# Patient Record
Sex: Female | Born: 1995
Health system: Southern US, Community
[De-identification: ages and names within clinical notes are randomized; demographics above are authoritative.]

---

## 2018-04-26 ENCOUNTER — Encounter (HOSPITAL_BASED_OUTPATIENT_CLINIC_OR_DEPARTMENT_OTHER): Payer: Self-pay | Admitting: *Deleted

## 2018-04-26 ENCOUNTER — Emergency Department (HOSPITAL_BASED_OUTPATIENT_CLINIC_OR_DEPARTMENT_OTHER)
Admission: EM | Admit: 2018-04-26 | Discharge: 2018-04-26 | Disposition: A | Discharge status: Home / Self Care | Payer: Self-pay | Attending: Emergency Medicine | Admitting: Emergency Medicine

## 2018-04-26 ENCOUNTER — Other Ambulatory Visit: Payer: Self-pay

## 2018-04-26 DIAGNOSIS — N920 Excessive and frequent menstruation with regular cycle: Secondary | ICD-10-CM | POA: Insufficient documentation

## 2018-04-26 LAB — URINALYSIS, ROUTINE W REFLEX MICROSCOPIC
Bilirubin Urine: NEGATIVE
Glucose, UA: NEGATIVE mg/dL
Hgb urine dipstick: NEGATIVE
Ketones, ur: NEGATIVE mg/dL
Leukocytes,Ua: NEGATIVE
Nitrite: NEGATIVE
Protein, ur: NEGATIVE mg/dL
Specific Gravity, Urine: 1.015 (ref 1.005–1.030)
pH: 7 (ref 5.0–8.0)

## 2018-04-26 LAB — PREGNANCY, URINE: PREG TEST UR: NEGATIVE

## 2018-04-26 MED ORDER — IBUPROFEN 600 MG PO TABS: 600.0000 mg | ORAL_TABLET | Freq: Four times a day (QID) | ORAL | 0 refills | Status: AC | PRN

## 2018-04-26 NOTE — Discharge Instructions (Addendum)
Please follow up with your primary care provider within 5-7 days for re-evaluation of your symptoms. If you do not have a primary care provider, information for a healthcare clinic has been provided for you to make arrangements for follow up care.   Please return to the ER sooner if you have any new or worsening symptoms, or if you have any of the following symptoms:  Abdominal pain that does not go away.  You have a fever.  You keep throwing up (vomiting).  The pain is felt only in portions of the abdomen. Pain in the right side could possibly be appendicitis. In an adult, pain in the left lower portion of the abdomen could be colitis or diverticulitis.  You pass bloody or black tarry stools.  There is bright red blood in the stool.  The constipation stays for more than 4 days.  There is belly (abdominal) or rectal pain.  You do not seem to be getting better.  You have any questions or concerns.

## 2018-04-26 NOTE — ED Triage Notes (Signed)
Pt c/o " period cramping" x 2 days

## 2018-04-26 NOTE — ED Provider Notes (Signed)
[facility] EMERGENCY DEPARTMENT Provider Note   CSN: [id number] Arrival date & time: 04/26/18  1702    History   Chief Complaint Chief Complaint  Patient presents with  . Menorrhagia    HPI Dominique Sanchez is a 23 y.o. female.     HPI   Pt is a 23 y/o female who presents to the ED for evaluation of menorrhagia. States she started having menstrual cramps yesterday. States pain was severe yesterday in the lower part of her abdomen.   States she usually has very bad cramps from her menstrual cycle and sxs feel exactly the same as prior cramps from menstruation. States she usually takes midol for sxs. She took this yesterday and had some improvement of pain, but she was concerned because it did not work as well as it had in the past.    States that right now she currently has no pain and rates pain 0/10.  Reports she has had some spotting but denies vaginal discharge. Denies  concern for STD. No NVD, constipation or urinary sxs. No fevers.  States her LMP was about 3-4 weeks ago. States menses are very regular.   History reviewed. No pertinent past medical history.  There are no active problems to display for this patient.  History reviewed. No pertinent surgical history.   OB History   No obstetric history on file.      Home Medications    Prior to Admission medications   Medication Sig Start Date End Date Taking? Authorizing Provider  ibuprofen (ADVIL,MOTRIN) 600 MG tablet Take 1 tablet (600 mg total) by mouth every 6 (six) hours as needed for up to 5 days. 04/26/18 05/01/18  Rayon Mcchristian S, PA-C    Family History History reviewed. No pertinent family history.  Social History Social History   Tobacco Use  . Smoking status: Never Smoker  . Smokeless tobacco: Never Used  Substance Use Topics  . Alcohol use: Not Currently  . Drug use: Not Currently     Allergies   Patient has no known allergies.   Review of Systems Review of Systems   Constitutional: Negative for fever.  HENT: Negative for ear pain and sore throat.   Eyes: Negative for visual disturbance.  Respiratory: Negative for cough and shortness of breath.   Cardiovascular: Negative for chest pain.  Gastrointestinal: Positive for abdominal pain. Negative for constipation, diarrhea, nausea and vomiting.  Genitourinary: Negative for dysuria, flank pain, hematuria and urgency.  Musculoskeletal: Negative for back pain.  Skin: Negative for rash.  Neurological: Negative for headaches.  All other systems reviewed and are negative.    Physical Exam Updated Vital Signs BP 107/67   Pulse 96   Temp 98.7 F (37.1 C) (Oral)   Resp 16   Ht 5\' 3"  (1.6 m)   Wt 53.1 kg   LMP 04/25/2018   SpO2 98%   BMI 20.73 kg/m   Physical Exam Vitals signs and nursing note reviewed.  Constitutional:      General: She is not in acute distress.    Appearance: She is well-developed.     Comments: Pt resting comfortably in bed and on cell phone when I enter the room.   HENT:     Head: Normocephalic and atraumatic.  Eyes:     Conjunctiva/sclera: Conjunctivae normal.  Neck:     Musculoskeletal: Neck supple.  Cardiovascular:     Rate and Rhythm: Normal rate and regular rhythm.     Heart sounds: Normal heart sounds.  No murmur.  Pulmonary:     Effort: Pulmonary effort is normal. No respiratory distress.     Breath sounds: Normal breath sounds.  Abdominal:     General: Bowel sounds are normal. There is no distension.     Palpations: Abdomen is soft.     Tenderness: There is no abdominal tenderness. There is no right CVA tenderness, left CVA tenderness, guarding or rebound.  Skin:    General: Skin is warm and dry.  Neurological:     Mental Status: She is alert.    ED Treatments / Results  Labs (all labs ordered are listed, but only abnormal results are displayed) Labs Reviewed  URINALYSIS, ROUTINE W REFLEX MICROSCOPIC - Abnormal; Notable for the following components:       Result Value   APPearance HAZY (*)    All other components within normal limits  PREGNANCY, URINE    EKG None  Radiology No results found.  Procedures Procedures (including critical care time)  Medications Ordered in ED Medications - No data to display   Initial Impression / Assessment and Plan / ED Course  I have reviewed the triage vital signs and the nursing notes.  Pertinent labs & imaging results that were available during my care of the patient were reviewed by me and considered in my medical decision making (see chart for details).  Final Clinical Impressions(s) / ED Diagnoses   Final diagnoses:  Menorrhagia with regular cycle   Pt here with c/o menstrual cramping. Currently states pain is 0/10 without any intervention with medication today. She is well appearing and is in no distress on evaluation. Vitals are normal. She has no abd tenderness on exam. Pelvic exam not felt to be indicated as pt is completely pain free. She denies concern for std and has no sxs. Labs not indicated. ua negative for uti. Urine preg negative. Pt discharged with rx for motrin. She is requesting a work note today. Advised close f/u and informed of return precautions.   ED Discharge Orders         Ordered    ibuprofen (ADVIL,MOTRIN) 600 MG tablet  Every 6 hours PRN     04/26/18 1746           Karrie Meres, PA-C 04/26/18 1756    Charlynne Pander, MD 04/26/18 [phone]

## 2018-04-26 NOTE — ED Notes (Signed)
ED Provider at bedside. 

## 2018-06-09 ENCOUNTER — Emergency Department (HOSPITAL_BASED_OUTPATIENT_CLINIC_OR_DEPARTMENT_OTHER): Payer: Self-pay

## 2018-06-09 ENCOUNTER — Emergency Department (HOSPITAL_BASED_OUTPATIENT_CLINIC_OR_DEPARTMENT_OTHER)
Admission: EM | Admit: 2018-06-09 | Discharge: 2018-06-09 | Disposition: A | Discharge status: Home / Self Care | Payer: Self-pay | Attending: Emergency Medicine | Admitting: Emergency Medicine

## 2018-06-09 ENCOUNTER — Other Ambulatory Visit: Payer: Self-pay

## 2018-06-09 ENCOUNTER — Encounter (HOSPITAL_BASED_OUTPATIENT_CLINIC_OR_DEPARTMENT_OTHER): Payer: Self-pay

## 2018-06-09 DIAGNOSIS — Y999 Unspecified external cause status: Secondary | ICD-10-CM | POA: Insufficient documentation

## 2018-06-09 DIAGNOSIS — X501XXA Overexertion from prolonged static or awkward postures, initial encounter: Secondary | ICD-10-CM | POA: Insufficient documentation

## 2018-06-09 DIAGNOSIS — Y939 Activity, unspecified: Secondary | ICD-10-CM | POA: Insufficient documentation

## 2018-06-09 DIAGNOSIS — Y929 Unspecified place or not applicable: Secondary | ICD-10-CM | POA: Insufficient documentation

## 2018-06-09 DIAGNOSIS — S99912A Unspecified injury of left ankle, initial encounter: Secondary | ICD-10-CM | POA: Insufficient documentation

## 2018-06-09 NOTE — ED Notes (Signed)
ED Provider at bedside. 

## 2018-06-09 NOTE — ED Triage Notes (Signed)
Pt states she "turned" her left ankle today-NAD-to triage in w/c

## 2018-06-09 NOTE — Discharge Instructions (Signed)
You have been seen today for an ankle injury. There were no acute abnormalities on the x-rays, including no sign of fracture or dislocation, however, there could be injuries to the soft tissues, such as the ligaments or tendons that are not seen on xrays. There could also be what are called occult fractures that are small fractures not seen on xray. Antiinflammatory medications: Take 600 mg of ibuprofen every 6 hours or 440 mg (over the counter dose) to 500 mg (prescription dose) of naproxen every 12 hours for the next 3 days. After this time, these medications may be used as needed for pain. Take these medications with food to avoid upset stomach. Choose only one of these medications, do not take them together. Acetaminophen (generic for Tylenol): Should you continue to have additional pain while taking the ibuprofen or naproxen, you may add in acetaminophen as needed. Your daily total maximum amount of acetaminophen from all sources should be limited to 4000mg /day for persons without liver problems, or 2000mg /day for those with liver problems. Ice: May apply ice to the area over the next 24 hours for 15 minutes at a time to reduce swelling. Elevation: Keep the extremity elevated as often as possible to reduce pain and inflammation. Support: Wear the ankle splint for support and comfort. Wear this until pain resolves. You will be weight-bearing as tolerated, which means you can slowly start to put weight on the extremity and increase amount and frequency as pain allows. Follow up: If symptoms are improving, you may follow up with your primary care provider for any continued management. If symptoms are not starting to improve within a week, you should follow up with the orthopedic specialist within two weeks. Return: Return to the ED for numbness, weakness, increasing pain, overall worsening symptoms, loss of function, or if symptoms are not improving, you have tried to follow up with the orthopedic  specialist, and have been unable to do so.  For prescription assistance, may try using prescription discount sites or apps, such as goodrx.com

## 2018-06-09 NOTE — ED Provider Notes (Addendum)
[facility] EMERGENCY DEPARTMENT Provider Note   CSN: [id number] Arrival date & time: 06/09/18  2235    History   Chief Complaint Chief Complaint  Patient presents with  . Ankle Injury    HPI Dominique Sanchez is a 23 y.o. female.     HPI   Dominique Sanchez is a 23 y.o. female, presenting to the ED with a left ankle injury that occurred this evening.  States she "turned" her ankle while "playing around."  She would not give further details.  When I asked other questions, she sighed and rolled her eyes.  Denies numbness, other injuries.     History reviewed. No pertinent past medical history.  There are no active problems to display for this patient.   History reviewed. No pertinent surgical history.   OB History   No obstetric history on file.      Home Medications    Prior to Admission medications   Not on File    Family History No family history on file.  Social History Social History   Tobacco Use  . Smoking status: Never Smoker  . Smokeless tobacco: Never Used  Substance Use Topics  . Alcohol use: Yes    Comment: occ  . Drug use: Yes    Types: Marijuana     Allergies   Patient has no known allergies.   Review of Systems Review of Systems  Musculoskeletal: Positive for arthralgias.  Neurological: Negative for numbness.     Physical Exam Updated Vital Signs BP 97/81 (BP Location: Right Arm)   Pulse 79   Temp 97.8 F (36.6 C) (Oral)   Resp 16   Ht 5\' 3"  (1.6 m)   Wt 53.1 kg   LMP 06/06/2018   SpO2 99%   BMI 20.73 kg/m   Physical Exam Vitals signs and nursing note reviewed.  Constitutional:      General: She is not in acute distress.    Appearance: She is well-developed. She is not diaphoretic.  HENT:     Head: Normocephalic and atraumatic.  Eyes:     Conjunctiva/sclera: Conjunctivae normal.  Neck:     Musculoskeletal: Neck supple.  Cardiovascular:     Rate and Rhythm: Normal rate and regular rhythm.     Pulses:  Normal pulses.          Dorsalis pedis pulses are 2+ on the right side and 2+ on the left side.       Posterior tibial pulses are 2+ on the right side and 2+ on the left side.  Pulmonary:     Effort: Pulmonary effort is normal.  Musculoskeletal:     Left ankle: Tenderness.       Feet:  Skin:    General: Skin is warm and dry.     Capillary Refill: Capillary refill takes less than 2 seconds.     Coloration: Skin is not pale.  Neurological:     Mental Status: She is alert.     Comments: Sensation to light touch grossly intact in the left foot and toes. Strength 5/5 in the left ankle.  Psychiatric:        Behavior: Behavior normal.      ED Treatments / Results  Labs (all labs ordered are listed, but only abnormal results are displayed) Labs Reviewed - No data to display  EKG None  Radiology Dg Ankle Complete Left  Result Date: 06/09/2018 CLINICAL DATA:  Injury EXAM: LEFT ANKLE COMPLETE - 3+ VIEW COMPARISON:  None.  FINDINGS: There is no evidence of fracture, dislocation, or joint effusion. There is no evidence of arthropathy or other focal bone abnormality. Soft tissues are unremarkable. IMPRESSION: Negative. Electronically Signed   By: Jasmine PangKim  Fujinaga M.D.   On: 06/09/2018 23:14    Procedures Procedures (including critical care time)  Medications Ordered in ED Medications - No data to display   Initial Impression / Assessment and Plan / ED Course  I have reviewed the triage vital signs and the nursing notes.  Pertinent labs & imaging results that were available during my care of the patient were reviewed by me and considered in my medical decision making (see chart for details).        Patient presents with left ankle injury.  No evidence of neurovascular compromise.  X-ray negative for acute osseous injury.  Patient placed in ASO splint and given crutches weightbearing as tolerated.  Orthopedic follow-up as needed. The patient was given instructions for home care as  well as return precautions. Patient voices understanding of these instructions, accepts the plan, and is comfortable with discharge.  Final Clinical Impressions(s) / ED Diagnoses   Final diagnoses:  Injury of left ankle, initial encounter    ED Discharge Orders    None       Concepcion LivingJoy, Dina Warbington C, PA-C 06/09/18 2343    Anselm PancoastJoy, Emeric Novinger C, PA-C 06/09/18 2344    Dione BoozeGlick, David, MD 06/10/18 2253

## 2018-07-12 ENCOUNTER — Other Ambulatory Visit: Payer: Self-pay

## 2018-07-12 ENCOUNTER — Emergency Department (HOSPITAL_BASED_OUTPATIENT_CLINIC_OR_DEPARTMENT_OTHER)
Admission: EM | Admit: 2018-07-12 | Discharge: 2018-07-12 | Disposition: A | Discharge status: Home / Self Care | Payer: Medicaid Other | Attending: Emergency Medicine | Admitting: Emergency Medicine

## 2018-07-12 ENCOUNTER — Encounter (HOSPITAL_BASED_OUTPATIENT_CLINIC_OR_DEPARTMENT_OTHER): Payer: Self-pay

## 2018-07-12 DIAGNOSIS — R1084 Generalized abdominal pain: Secondary | ICD-10-CM

## 2018-07-12 LAB — PREGNANCY, URINE: Preg Test, Ur: NEGATIVE

## 2018-07-12 LAB — URINALYSIS, ROUTINE W REFLEX MICROSCOPIC
Bilirubin Urine: NEGATIVE
Glucose, UA: NEGATIVE mg/dL
Hgb urine dipstick: NEGATIVE
Ketones, ur: NEGATIVE mg/dL
Leukocytes,Ua: NEGATIVE
Nitrite: NEGATIVE
Protein, ur: NEGATIVE mg/dL
Specific Gravity, Urine: >1.03 — ABNORMAL HIGH (ref 1.005–1.030)
pH: 6.5 (ref 5.0–8.0)

## 2018-07-12 MED ORDER — DICYCLOMINE HCL 20 MG PO TABS: 20.0000 mg | ORAL_TABLET | Freq: Two times a day (BID) | ORAL | 0 refills | Status: AC

## 2018-07-12 NOTE — ED Provider Notes (Signed)
[facility] EMERGENCY DEPARTMENT Provider Note   CSN: [id number] Arrival date & time: 07/12/18  1745    History   Chief Complaint Chief Complaint  Patient presents with  . Abdominal Pain    HPI Dominique Sanchez is a 23 y.o. female.     23 yo F with a chief complaint of diffuse abdominal cramping.  This started after lunch today when the patient had a large meal.  No one else that she knows ate with her and so no one else is sick.  She denies nausea vomiting or diarrhea.  She feels like she may need to have a bowel movement but has not yet.  Denies urinary symptoms.  Denies fevers or chills.  The history is provided by the patient.  Abdominal Pain  Pain location:  Generalized Pain quality: cramping   Pain radiates to:  Does not radiate Pain severity:  Moderate Onset quality:  Gradual Duration:  4 hours Timing:  Constant Progression:  Unchanged Chronicity:  New Relieved by:  Nothing Associated symptoms: no chest pain, no chills, no dysuria, no fever, no nausea, no shortness of breath and no vomiting     History reviewed. No pertinent past medical history.  There are no active problems to display for this patient.   History reviewed. No pertinent surgical history.   OB History   No obstetric history on file.      Home Medications    Prior to Admission medications   Medication Sig Start Date End Date Taking? Authorizing Provider  dicyclomine (BENTYL) 20 MG tablet Take 1 tablet (20 mg total) by mouth 2 (two) times daily. 07/12/18   Melene PlanFloyd, Nakhi Choi, DO    Family History No family history on file.  Social History Social History   Tobacco Use  . Smoking status: Never Smoker  . Smokeless tobacco: Never Used  Substance Use Topics  . Alcohol use: Yes    Comment: occ  . Drug use: Yes    Types: Marijuana     Allergies   Patient has no known allergies.   Review of Systems Review of Systems  Constitutional: Negative for chills and fever.  HENT:  Negative for congestion and rhinorrhea.   Eyes: Negative for redness and visual disturbance.  Respiratory: Negative for shortness of breath and wheezing.   Cardiovascular: Negative for chest pain and palpitations.  Gastrointestinal: Positive for abdominal pain. Negative for nausea and vomiting.  Genitourinary: Negative for dysuria and urgency.  Musculoskeletal: Negative for arthralgias and myalgias.  Skin: Negative for pallor and wound.  Neurological: Negative for dizziness and headaches.     Physical Exam Updated Vital Signs BP 95/60 (BP Location: Left Arm)   Pulse 84   Temp 99 F (37.2 C) (Oral)   Resp 20   Ht 5\' 3"  (1.6 m)   Wt 52.6 kg   LMP 07/10/2018   SpO2 99%   BMI 20.55 kg/m   Physical Exam Vitals signs and nursing note reviewed.  Constitutional:      General: She is not in acute distress.    Appearance: She is well-developed. She is not diaphoretic.  HENT:     Head: Normocephalic and atraumatic.  Eyes:     Pupils: Pupils are equal, round, and reactive to light.  Neck:     Musculoskeletal: Normal range of motion and neck supple.  Cardiovascular:     Rate and Rhythm: Normal rate and regular rhythm.     Heart sounds: No murmur. No friction rub. No gallop.  Pulmonary:     Effort: Pulmonary effort is normal.     Breath sounds: No wheezing or rales.  Abdominal:     General: There is no distension.     Palpations: Abdomen is soft.     Tenderness: There is no abdominal tenderness.     Comments: Benign abdominal exam  Musculoskeletal:        General: No tenderness.  Skin:    General: Skin is warm and dry.  Neurological:     Mental Status: She is alert and oriented to person, place, and time.  Psychiatric:        Behavior: Behavior normal.      ED Treatments / Results  Labs (all labs ordered are listed, but only abnormal results are displayed) Labs Reviewed  URINALYSIS, ROUTINE W REFLEX MICROSCOPIC - Abnormal; Notable for the following components:       Result Value   APPearance CLOUDY (*)    Specific Gravity, Urine >1.030 (*)    All other components within normal limits  PREGNANCY, URINE    EKG None  Radiology No results found.  Procedures Procedures (including critical care time)  Medications Ordered in ED Medications - No data to display   Initial Impression / Assessment and Plan / ED Course  I have reviewed the triage vital signs and the nursing notes.  Pertinent labs & imaging results that were available during my care of the patient were reviewed by me and considered in my medical decision making (see chart for details).        23 yo F with a chief complaint of diffuse abdominal cramping.  This is after eating a large lunch.  She has a benign abdominal exam for me.  Urine is negative for infection she is not pregnant.  I feel that a laboratory evaluation would be unlikely to be helpful.  We will have the patient use a short course of Bentyl.  6:33 PM:  I have discussed the diagnosis/risks/treatment options with the patient and believe the pt to be eligible for discharge home to follow-up with PCP. We also discussed returning to the ED immediately if new or worsening sx occur. We discussed the sx which are most concerning (e.g., sudden worsening pain, fever, inability to tolerate by mouth ) that necessitate immediate return. Medications administered to the patient during their visit and any new prescriptions provided to the patient are listed below.  Medications given during this visit Medications - No data to display   The patient appears reasonably screen and/or stabilized for discharge and I doubt any other medical condition or other [facility] requiring further screening, evaluation, or treatment in the ED at this time prior to discharge.    Final Clinical Impressions(s) / ED Diagnoses   Final diagnoses:  Generalized abdominal pain    ED Discharge Orders         Ordered    dicyclomine (BENTYL) 20 MG tablet  2 times  daily     07/12/18 1826           Deno Etienne, DO 07/12/18 [phone]

## 2018-07-12 NOTE — Discharge Instructions (Signed)
Return for fever, vomiting, worsening pain.  Follow up with your doctor.

## 2018-07-12 NOTE — ED Triage Notes (Signed)
C/o abd pain started ~30-45 min PTA after eating [place] food-denies v/d-NAD-steady gait

## 2018-07-13 MED FILL — DICYCLOMINE 20 MG TABLET: 20 | 10 days supply | Qty: 20 | Fill #0

## 2018-09-05 ENCOUNTER — Encounter (HOSPITAL_BASED_OUTPATIENT_CLINIC_OR_DEPARTMENT_OTHER): Payer: Self-pay | Admitting: Emergency Medicine

## 2018-09-05 ENCOUNTER — Other Ambulatory Visit: Payer: Self-pay

## 2018-09-05 ENCOUNTER — Emergency Department (HOSPITAL_BASED_OUTPATIENT_CLINIC_OR_DEPARTMENT_OTHER)
Admission: EM | Admit: 2018-09-05 | Discharge: 2018-09-05 | Disposition: A | Discharge status: Home / Self Care | Payer: Medicaid Other | Attending: Emergency Medicine | Admitting: Emergency Medicine

## 2018-09-05 DIAGNOSIS — M542 Cervicalgia: Secondary | ICD-10-CM

## 2018-09-05 DIAGNOSIS — J039 Acute tonsillitis, unspecified: Secondary | ICD-10-CM | POA: Insufficient documentation

## 2018-09-05 DIAGNOSIS — J029 Acute pharyngitis, unspecified: Secondary | ICD-10-CM

## 2018-09-05 LAB — GROUP A STREP BY PCR: Group A Strep by PCR: NOT DETECTED

## 2018-09-05 MED ORDER — IBUPROFEN 600 MG PO TABS: 600.0000 mg | ORAL_TABLET | Freq: Four times a day (QID) | ORAL | 0 refills | Status: DC | PRN

## 2018-09-05 MED ORDER — ACETAMINOPHEN 500 MG PO TABS: 500.0000 mg | ORAL_TABLET | Freq: Four times a day (QID) | ORAL | 0 refills | Status: AC | PRN

## 2018-09-05 MED ORDER — DEXAMETHASONE 6 MG PO TABS
10.0000 mg | ORAL_TABLET | Freq: Once | ORAL | Status: AC
  Administered 2018-09-05: 14:00:00 10 mg via ORAL
  Filled 2018-09-05: qty 1

## 2018-09-05 MED ORDER — AMOXICILLIN 500 MG PO CAPS: 500.0000 mg | ORAL_CAPSULE | Freq: Two times a day (BID) | ORAL | 0 refills | Status: AC

## 2018-09-05 NOTE — ED Provider Notes (Signed)
[facility] EMERGENCY DEPARTMENT Provider Note   CSN: [id number] Arrival date & time: 09/05/18  1251    History   Chief Complaint Chief Complaint  Patient presents with  . Neck Pain    HPI Dominique Sanchez is a 23 y.o. female who presents with a 2-day history of right-sided throat and neck pain.  It is worse when she swallows.  She denies any injury.  She reports it started after she swallowed a hot piece of fried chicken.  Patient denies any associated symptoms including fever, cough, nasal congestion, ear pain.  She did not take any medications at home for symptoms.  She was around someone with strep throat in the last couple weeks and does report they were sharing drinks. Patient denies any oral sex.     HPI  History reviewed. No pertinent past medical history.  There are no active problems to display for this patient.   History reviewed. No pertinent surgical history.   OB History   No obstetric history on file.      Home Medications    Prior to Admission medications   Medication Sig Start Date End Date Taking? Authorizing Provider  acetaminophen (TYLENOL) 500 MG tablet Take 1 tablet (500 mg total) by mouth every 6 (six) hours as needed. 09/05/18   Elodia Haviland, Waylan BogaAlexandra M, PA-C  amoxicillin (AMOXIL) 500 MG capsule Take 1 capsule (500 mg total) by mouth 2 (two) times daily. 09/05/18   Niamh Rada, Waylan BogaAlexandra M, PA-C  dicyclomine (BENTYL) 20 MG tablet Take 1 tablet (20 mg total) by mouth 2 (two) times daily. 07/12/18   Melene PlanFloyd, Dan, DO  ibuprofen (ADVIL) 600 MG tablet Take 1 tablet (600 mg total) by mouth every 6 (six) hours as needed. 09/05/18   Emi HolesLaw, Yitzel Shasteen M, PA-C    Family History No family history on file.  Social History Social History   Tobacco Use  . Smoking status: Never Smoker  . Smokeless tobacco: Never Used  Substance Use Topics  . Alcohol use: Yes    Comment: occ  . Drug use: Yes    Types: Marijuana     Allergies   Patient has no known allergies.    Review of Systems Review of Systems  Constitutional: Negative for fever.  HENT: Positive for sore throat. Negative for congestion and ear pain.   Respiratory: Negative for cough and shortness of breath.   Cardiovascular: Negative for chest pain.  Musculoskeletal: Positive for neck pain.     Physical Exam Updated Vital Signs BP 114/71 (BP Location: Right Arm)   Pulse 91   Temp 98.3 F (36.8 C) (Oral)   Resp 17   Ht 5\' 3"  (1.6 m)   Wt 50.8 kg   LMP 09/02/2018   SpO2 99%   BMI 19.84 kg/m   Physical Exam Vitals signs and nursing note reviewed.  Constitutional:      General: She is not in acute distress.    Appearance: She is well-developed. She is not diaphoretic.  HENT:     Head: Normocephalic and atraumatic.     Right Ear: Tympanic membrane normal.     Left Ear: Tympanic membrane normal.     Mouth/Throat:     Pharynx: Pharyngeal swelling, oropharyngeal exudate and posterior oropharyngeal erythema present.     Tonsils: Tonsillar exudate present. No tonsillar abscesses. 2+ on the right. 1+ on the left.     Comments: Tonsillar erythema and edema with exudate on the right; left looks basically normal Eyes:  General: No scleral icterus.       Right eye: No discharge.        Left eye: No discharge.     Conjunctiva/sclera: Conjunctivae normal.     Pupils: Pupils are equal, round, and reactive to light.  Neck:     Musculoskeletal: Full passive range of motion without pain, normal range of motion and neck supple.     Thyroid: No thyromegaly.     Comments: Some tenderness under the submandibular region and one area along the anterior chain, but no palpable lymph nodes; no masses or edema noted Cardiovascular:     Rate and Rhythm: Normal rate and regular rhythm.     Heart sounds: Normal heart sounds. No murmur. No friction rub. No gallop.   Pulmonary:     Effort: Pulmonary effort is normal. No respiratory distress.     Breath sounds: Normal breath sounds. No stridor. No  wheezing or rales.  Abdominal:     General: Bowel sounds are normal. There is no distension.     Palpations: Abdomen is soft.     Tenderness: There is no abdominal tenderness. There is no guarding or rebound.  Lymphadenopathy:     Cervical: No cervical adenopathy.  Skin:    General: Skin is warm and dry.     Coloration: Skin is not pale.     Findings: No rash.  Neurological:     Mental Status: She is alert.     Coordination: Coordination normal.      ED Treatments / Results  Labs (all labs ordered are listed, but only abnormal results are displayed) Labs Reviewed  GROUP A STREP BY PCR    EKG None  Radiology No results found.  Procedures Procedures (including critical care time)  Medications Ordered in ED Medications  dexamethasone (DECADRON) tablet 10 mg (10 mg Oral Given 09/05/18 1349)     Initial Impression / Assessment and Plan / ED Course  I have reviewed the triage vital signs and the nursing notes.  Pertinent labs & imaging results that were available during my care of the patient were reviewed by me and considered in my medical decision making (see chart for details).        Patient presenting with a unilateral tonsillitis with exudate.  Strep is negative.  Considering appearance and contact with strep positive person, will treat with amoxicillin.  Decadron given.  Patient denies any oral intercourse, so gonorrhea chlamydia risk is low.  No indication for imaging at this time.  Low suspicion of deep space infection.  Patient advised to take ibuprofen and Tylenol and gargle with salt water.  Return precautions discussed including unilateral swelling, trismus, inability to tolerate saliva.  Patient understands and agrees with plan.  Patient vital stable throughout ED course and discharged in satisfactory condition. I discussed patient case with Dr. Ronnald Nian who guided the patient's management and agrees with plan.   Final Clinical Impressions(s) / ED Diagnoses    Final diagnoses:  Sore throat  Neck pain    ED Discharge Orders         Ordered    amoxicillin (AMOXIL) 500 MG capsule  2 times daily     09/05/18 1454    ibuprofen (ADVIL) 600 MG tablet  Every 6 hours PRN     09/05/18 1454    acetaminophen (TYLENOL) 500 MG tablet  Every 6 hours PRN     09/05/18 1454           Seretha Estabrooks, Bea Graff,  PA-C 09/05/18 1649    Virgina Norfolkuratolo, Adam, DO 09/05/18 1706

## 2018-09-05 NOTE — Discharge Instructions (Addendum)
Your strep test was negative, however considering the swelling in pus on the tonsils and is only on one side, I will cover with antibiotics to treat bacterial infection.  Take amoxicillin until completed, even if you are feeling better.  Take ibuprofen and Tylenol as prescribed, as needed for pain.  You can gargle with warm salt water several times daily as well.  Please return to the emergency department if you develop any new or worsening symptoms including asymmetry in your throat with one tonsil moving toward the midline, lockjaw, inability to swallow your own spit, large masses in your neck, or any other concerning symptoms.

## 2018-09-05 NOTE — ED Triage Notes (Signed)
R side neck pain since yesterday.

## 2019-07-03 ENCOUNTER — Other Ambulatory Visit: Payer: Self-pay

## 2019-07-03 ENCOUNTER — Inpatient Hospital Stay (HOSPITAL_COMMUNITY)
Admission: AD | Admit: 2019-07-03 | Discharge: 2019-07-03 | Disposition: A | Discharge status: Home / Self Care | Payer: Medicaid Other | Attending: Obstetrics and Gynecology | Admitting: Obstetrics and Gynecology

## 2019-07-03 DIAGNOSIS — Z3201 Encounter for pregnancy test, result positive: Secondary | ICD-10-CM

## 2019-07-03 DIAGNOSIS — Z7689 Persons encountering health services in other specified circumstances: Secondary | ICD-10-CM

## 2019-07-03 DIAGNOSIS — Z331 Pregnant state, incidental: Secondary | ICD-10-CM | POA: Insufficient documentation

## 2019-07-03 NOTE — MAU Note (Signed)
Dominique Sanchez is a 24 y.o. here in MAU reporting: states she had a + UPT at home. Wants to know how many weeks she is. No pain, bleeding, or discharge.  LMP: 06/10/19 approximately. States it was abnormal color and bleeding was only a couple of days.  Pain score: 0/10  Vitals:   07/03/19 1625  BP: 116/73  Pulse: 79  Resp: 16  Temp: 98.6 F (37 C)  SpO2: 100%     Lab orders placed from triage: none

## 2019-07-03 NOTE — MAU Provider Note (Signed)
First Provider Initiated Contact with Patient 07/03/19 1631      S Ms. Dominique Sanchez is a 24 y.o. No obstetric history on file pregnant female who presents to MAU today with complaint of none. Patient reports she was told to come here by her cousin for an ultrasound to prove to the baby's father that the baby is his. UPT positive at home.  O BP 116/73 (BP Location: Right Arm)   Pulse 79   Temp 98.6 F (37 C) (Oral)   Resp 16   SpO2 100% Comment: room air   Patient Vitals for the past 24 hrs:  BP Temp Temp src Pulse Resp SpO2  07/03/19 1625 116/73 98.6 F (37 C) Oral 79 16 100 %   Physical Exam  Constitutional: She is oriented to person, place, and time. She appears well-developed and well-nourished. No distress.  HENT:  Head: Normocephalic and atraumatic.  Respiratory: Effort normal.  Neurological: She is alert and oriented to person, place, and time.  Skin: She is not diaphoretic.  Psychiatric: She has a normal mood and affect. Her behavior is normal. Judgment and thought content normal.   A Pregnant female Medical screening exam complete  P Discharge from MAU in stable condition Patient given the option of transfer to [facility] for further evaluation or seek care in outpatient facility of choice List of options for follow-up given, printed list of OB providers given Warning signs for worsening condition that would warrant emergency follow-up discussed Patient may return to MAU as needed for pregnancy related complaints  Nugent, Odie Sera, NP 07/03/2019 5:03 PM

## 2019-07-06 ENCOUNTER — Inpatient Hospital Stay (HOSPITAL_COMMUNITY)
Admission: AD | Admit: 2019-07-06 | Discharge: 2019-07-06 | Disposition: A | Discharge status: Home / Self Care | Payer: Medicaid Other | Attending: Family Medicine | Admitting: Family Medicine

## 2019-07-06 ENCOUNTER — Other Ambulatory Visit: Payer: Self-pay

## 2019-07-06 ENCOUNTER — Telehealth: Payer: Self-pay | Admitting: Obstetrics and Gynecology

## 2019-07-06 DIAGNOSIS — Z3202 Encounter for pregnancy test, result negative: Secondary | ICD-10-CM

## 2019-07-06 DIAGNOSIS — N939 Abnormal uterine and vaginal bleeding, unspecified: Secondary | ICD-10-CM | POA: Insufficient documentation

## 2019-07-06 LAB — HCG, QUANTITATIVE, PREGNANCY: hCG, Beta Chain, Quant, S: <1 m[IU]/mL (ref <5)

## 2019-07-06 LAB — POCT PREGNANCY, URINE: Preg Test, Ur: NEGATIVE

## 2019-07-06 NOTE — Telephone Encounter (Signed)
Patient called and notified of Quant results which are less than 1.   Duane Lope, NP 07/06/2019 3:43 PM

## 2019-07-06 NOTE — Discharge Instructions (Signed)
Human Chorionic Gonadotropin Test Why am I having this test? A human chorionic gonadotropin (hCG) test is done to determine whether you are pregnant. It can also be used:  To diagnose an abnormal pregnancy.  To determine whether you have had a failed pregnancy (miscarriage) or are at risk of one. What is being tested? This test checks the level of the human chorionic gonadotropin (hCG) hormone in the blood. This hormone is produced during pregnancy by the cells that form the placenta. The placenta is the organ that grows inside your womb (uterus) to nourish a developing baby. When you are pregnant, hCG can be detected in your blood or urine 7 to 8 days before your missed period. It continues to go up for the first 8-10 weeks of pregnancy. The presence of hCG in your blood can be measured with several different types of tests. You may have:  A urine test. ? Because this hormone is eliminated from your body by your kidneys, you may have a urine test to find out whether you are pregnant. A home pregnancy test detects whether there is hCG in your urine. ? A urine test only shows whether there is hCG in your urine. It does not measure how much.  A qualitative blood test. ? You may have this type of blood test to find out if you are pregnant. ? This blood test only shows whether there is hCG in your blood. It does not measure how much.  A quantitative blood test. ? This type of blood test measures the amount of hCG in your blood. ? You may have this test to:  Diagnose an abnormal pregnancy.  Check whether you have had a miscarriage.  Determine whether you are at risk of a miscarriage. What kind of sample is taken?     Two kinds of samples may be collected to test for the hCG hormone.  Blood. It is usually collected by inserting a needle into a blood vessel.  Urine. It is usually collected by urinating into a germ-free (sterile) specimen cup. It is best to collect the sample the first  time you urinate in the morning. How do I prepare for this test? No preparation is needed for a blood test.  For the urine test:  Let your health care provider know about: ? All medicines you are taking, including vitamins, herbs, creams, and over-the-counter medicines. ? Any blood in your urine. This may interfere with the result.  Do not drink too much fluid. Drink as you normally would, or as directed by your health care provider. How are the results reported? Depending on the type of test that you have, your test results may be reported as values. Your health care provider will compare your results to normal ranges that were established after testing a large group of people (reference ranges). Reference ranges may vary among labs and hospitals. For this test, common reference ranges that show absence of pregnancy are:  Quantitative hCG blood levels: less than 5 IU/L. Other results will be reported as either positive or negative. For this test, normal results (meaning the absence of pregnancy) are:  Negative for hCG in the urine test.  Negative for hCG in the qualitative blood test. What do the results mean? Urine and qualitative blood test  A negative result could mean: ? That you are not pregnant. ? That the test was done too early in your pregnancy to detect hCG in your blood or urine. If you still have other signs   of pregnancy, the test will be repeated.  A positive result means: ? That you are most likely pregnant. Your health care provider may confirm your pregnancy with an imaging study (ultrasound) of your uterus, if needed. Quantitative blood test Results of the quantitative hCG blood test will be interpreted as follows:  Less than 5 IU/L: You are most likely not pregnant.  Greater than 25 IU/L: You are most likely pregnant.  hCG levels that are higher than expected: ? You are pregnant with twins. ? You have abnormal growths in the uterus.  hCG levels that are  rising more slowly than expected: ? You have an ectopic pregnancy (also called a tubal pregnancy).  hCG levels that are falling: ? You may be having a miscarriage. Talk with your health care provider about what your results mean. Questions to ask your health care provider Ask your health care provider, or the department that is doing the test:  When will my results be ready?  How will I get my results?  What are my treatment options?  What other tests do I need?  What are my next steps? Summary  A human chorionic gonadotropin test is done to determine whether you are pregnant.  When you are pregnant, hCG can be detected in your blood or urine 7 to 8 days before your missed period. It continues to go up for the first 8-10 weeks of pregnancy.  Your hCG level can be measured with different types of tests. You may have a urine test, a qualitative blood test, or a quantitative blood test.  Talk with your health care provider about what your results mean. This information is not intended to replace advice given to you by your health care provider. Make sure you discuss any questions you have with your health care provider. Document Revised: 12/22/2016 Document Reviewed: 12/22/2016 Elsevier Patient Education  2020 [employer]  

## 2019-07-06 NOTE — MAU Provider Note (Signed)
None     S Ms. Dominique Sanchez is a 24 y.o. No obstetric history on file questionable early pregnant female who presents to MAU today with complaint of vaginal bleeding.  She states she had a positive UPT at home, but UPT today shows negative.   Patient tearful and questions "did I lose my baby."  Patient reports that she has been having light bleeding for the past two days.  Patient denies cramping or passing of clots.   O BP 109/69   Pulse 69   Temp 98.3 F (36.8 C)   Resp 15  Physical Exam  Constitutional: She is oriented to person, place, and time. She appears well-developed and well-nourished.  HENT:  Head: Normocephalic and atraumatic.  Eyes: Conjunctivae are normal.  Cardiovascular: Normal rate.  Respiratory: Effort normal.  Musculoskeletal:        General: Normal range of motion.     Cervical back: Normal range of motion.  Neurological: She is alert and oriented to person, place, and time.  Skin: Skin is warm and dry.  Psychiatric: She has a normal mood and affect. Her behavior is normal.    A 24 year old Female Medical screening exam complete Vaginal Bleeding +Home UPT  P -Patient informed that provider would need further information before making a diagnosis of miscarriage. -hCG ordered and drawn. -Patient given option to wait or receive phone call in am with results. -Patient opts for phone call. -Discharge from MAU in stable condition -Warning signs for worsening condition that would warrant emergency follow-up discussed -Patient may return to MAU as needed for pregnancy related complaints  Gerrit Heck, CNM 07/06/2019 4:00 AM

## 2019-07-06 NOTE — MAU Note (Signed)
Patient states she woke up tonight with a small amount of pink vaginal bleeding around 0222.  Denies abdominal cramping.  LMP around 04/30/19.  States she had a 2 day period on 5/14.  Patient states she had a positive HPT on 06/21/19.

## 2019-09-24 IMAGING — DX LEFT ANKLE COMPLETE - 3+ VIEW
3 series · 3 of 3 positions shown · non-contrast
Comparison: None.

CLINICAL DATA: Injury

EXAM:
LEFT ANKLE COMPLETE - 3+ VIEW

[ankle ap]
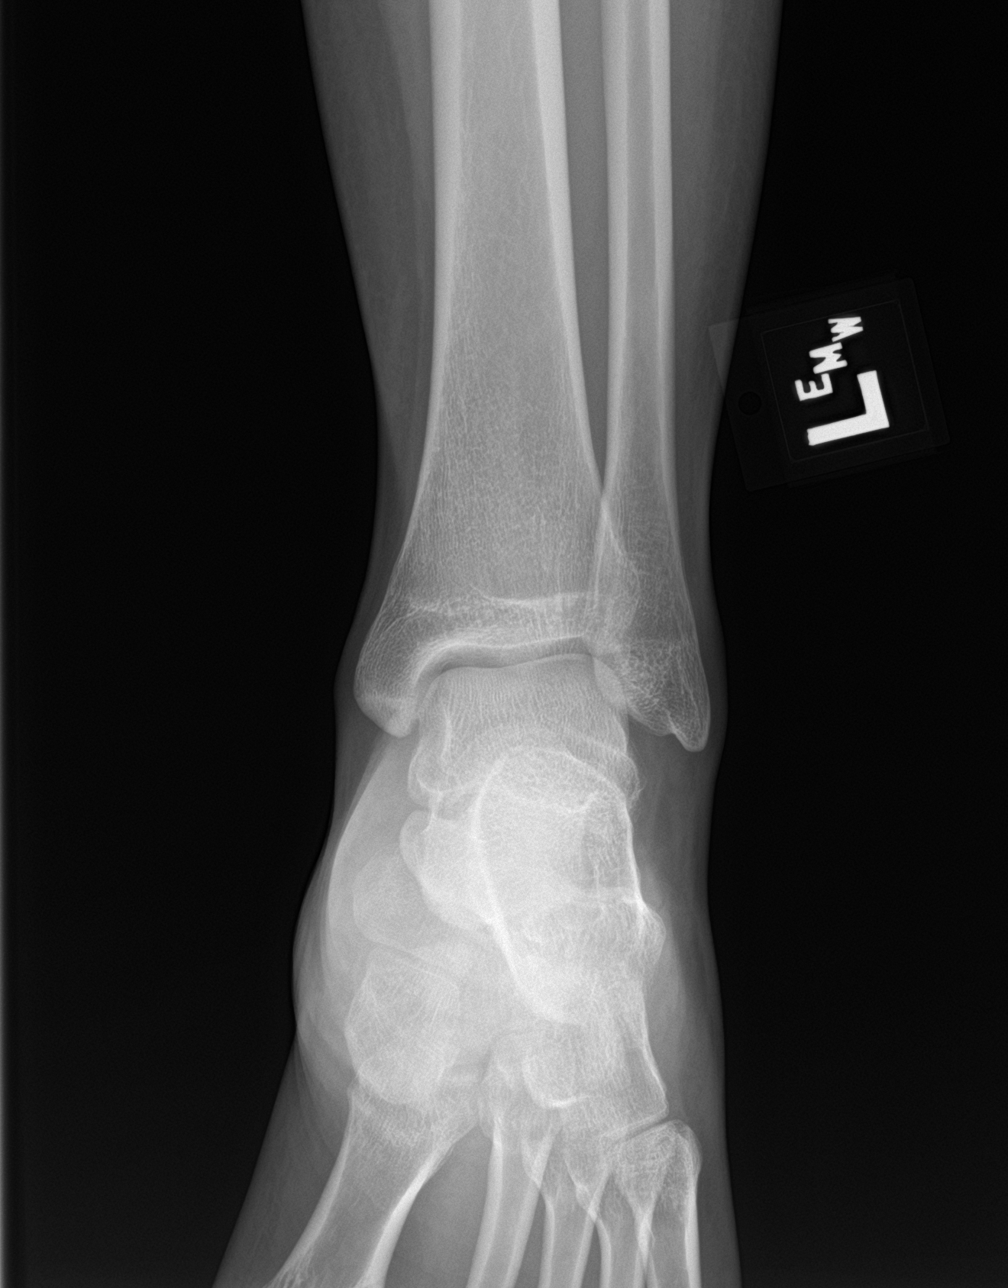

[ankle obl]
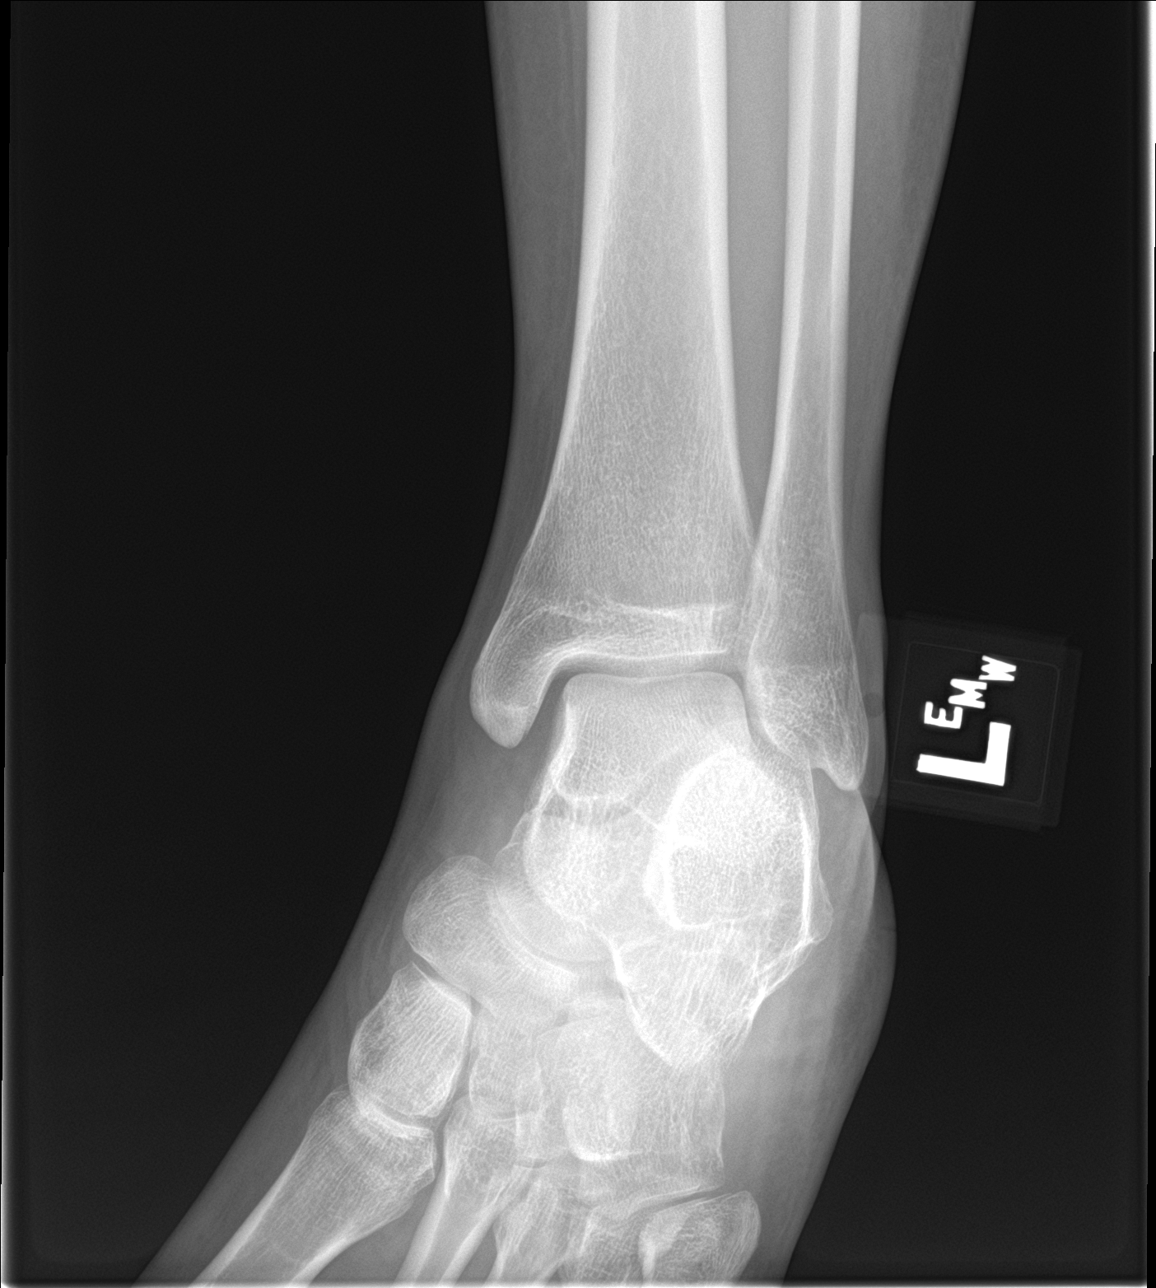

[ankle lat]
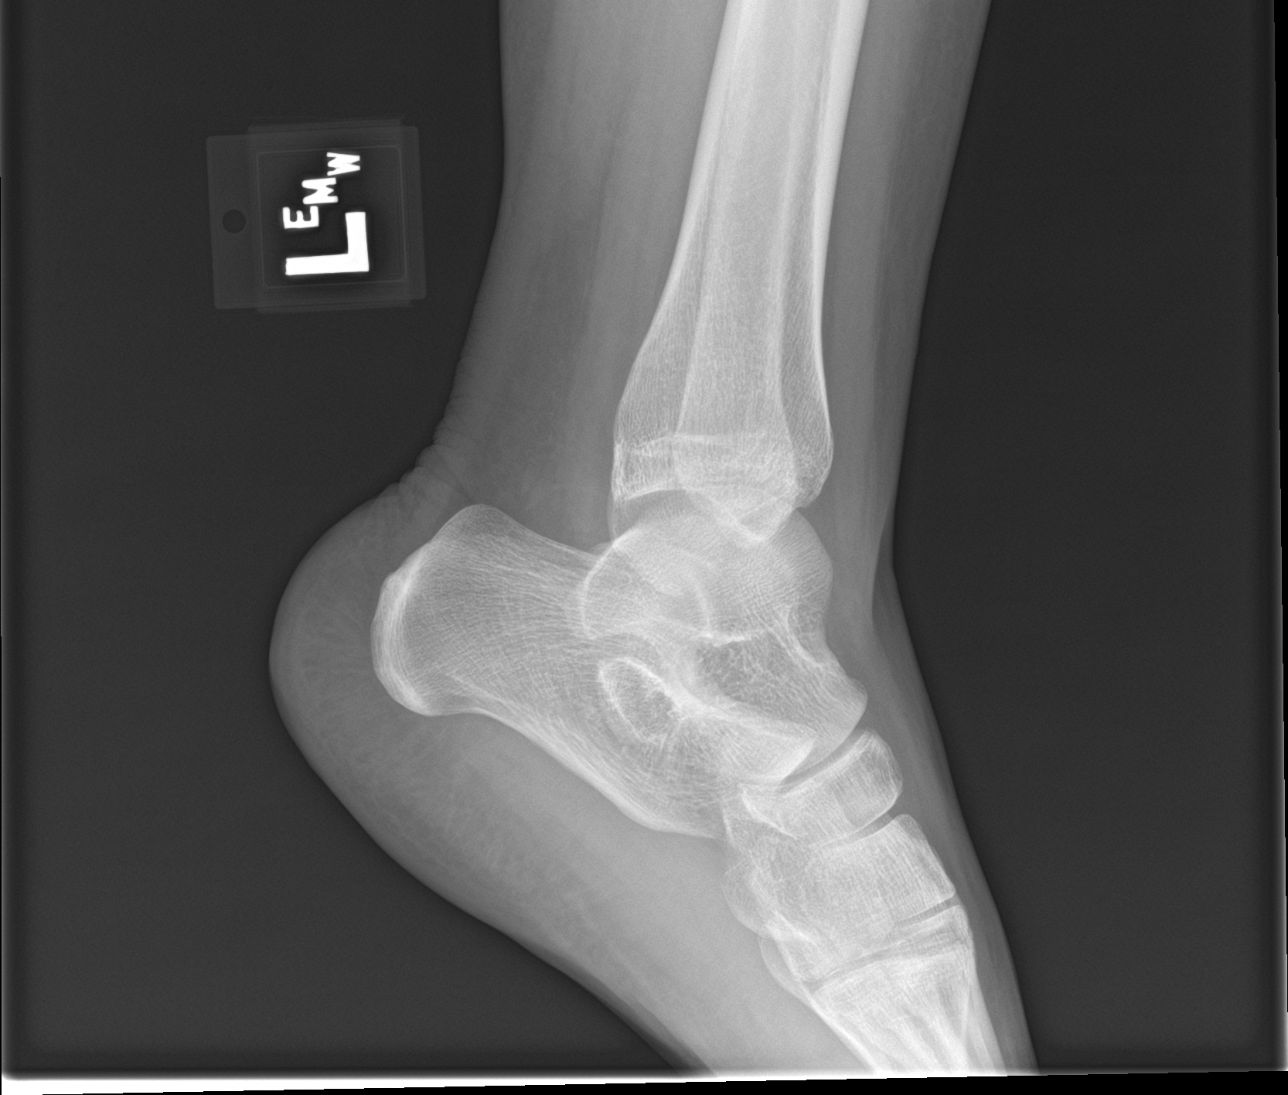

[3 of 3 positions shown; findings below may reference images not displayed]

FINDINGS: There is no evidence of fracture, dislocation, or joint effusion.
There is no evidence of arthropathy or other focal bone abnormality.
Soft tissues are unremarkable.
IMPRESSION: Negative.

## 2022-04-26 ENCOUNTER — Emergency Department (HOSPITAL_COMMUNITY)
Admission: EM | Admit: 2022-04-26 | Discharge: 2022-04-26 | Disposition: A | Discharge status: Home / Self Care | Payer: Medicaid Other | Attending: Emergency Medicine | Admitting: Emergency Medicine

## 2022-04-26 ENCOUNTER — Emergency Department (HOSPITAL_COMMUNITY): Payer: Medicaid Other

## 2022-04-26 ENCOUNTER — Other Ambulatory Visit: Payer: Self-pay

## 2022-04-26 ENCOUNTER — Encounter (HOSPITAL_COMMUNITY): Payer: Self-pay

## 2022-04-26 DIAGNOSIS — Y92410 Unspecified street and highway as the place of occurrence of the external cause: Secondary | ICD-10-CM | POA: Insufficient documentation

## 2022-04-26 DIAGNOSIS — S161XXA Strain of muscle, fascia and tendon at neck level, initial encounter: Secondary | ICD-10-CM | POA: Diagnosis not present

## 2022-04-26 DIAGNOSIS — S60811A Abrasion of right wrist, initial encounter: Secondary | ICD-10-CM | POA: Diagnosis not present

## 2022-04-26 DIAGNOSIS — M542 Cervicalgia: Secondary | ICD-10-CM | POA: Diagnosis present

## 2022-04-26 DIAGNOSIS — S39012A Strain of muscle, fascia and tendon of lower back, initial encounter: Secondary | ICD-10-CM | POA: Diagnosis not present

## 2022-04-26 LAB — URINALYSIS, ROUTINE W REFLEX MICROSCOPIC
Bilirubin Urine: NEGATIVE
Glucose, UA: NEGATIVE mg/dL
Hgb urine dipstick: NEGATIVE
Ketones, ur: 80 mg/dL — AB
Leukocytes,Ua: NEGATIVE
Nitrite: NEGATIVE
Protein, ur: 100 mg/dL — AB
Specific Gravity, Urine: 1.032 — ABNORMAL HIGH (ref 1.005–1.030)
pH: 5 (ref 5.0–8.0)

## 2022-04-26 LAB — PREGNANCY, URINE: Preg Test, Ur: NEGATIVE

## 2022-04-26 MED ORDER — MORPHINE SULFATE (PF) 4 MG/ML IV SOLN
4.0000 mg | Freq: Once | INTRAVENOUS | Status: AC
  Administered 2022-04-26: 4 mg via INTRAMUSCULAR
  Filled 2022-04-26: qty 1

## 2022-04-26 MED ORDER — METHOCARBAMOL 500 MG PO TABS: 500.0000 mg | ORAL_TABLET | Freq: Two times a day (BID) | ORAL | 0 refills | Status: AC | PRN

## 2022-04-26 MED ORDER — IBUPROFEN 600 MG PO TABS: 600.0000 mg | ORAL_TABLET | Freq: Four times a day (QID) | ORAL | 0 refills | Status: AC | PRN

## 2022-04-26 MED ORDER — HYDROCODONE-ACETAMINOPHEN 5-325 MG PO TABS: 1.0000 | ORAL_TABLET | ORAL | 0 refills | Status: AC | PRN

## 2022-04-26 MED ORDER — KETOROLAC TROMETHAMINE 30 MG/ML IJ SOLN
30.0000 mg | Freq: Once | INTRAMUSCULAR | Status: AC
  Administered 2022-04-26: 30 mg via INTRAMUSCULAR
  Filled 2022-04-26: qty 1
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
## 2022-06-04 ENCOUNTER — Other Ambulatory Visit: Payer: Self-pay

## 2022-06-04 ENCOUNTER — Emergency Department (HOSPITAL_COMMUNITY)
Admission: EM | Admit: 2022-06-04 | Discharge: 2022-06-04 | Discharge status: Against Medical Advice | Payer: Medicaid Other | Attending: Emergency Medicine | Admitting: Emergency Medicine

## 2022-06-04 DIAGNOSIS — G43909 Migraine, unspecified, not intractable, without status migrainosus: Secondary | ICD-10-CM | POA: Insufficient documentation

## 2022-06-04 DIAGNOSIS — R079 Chest pain, unspecified: Secondary | ICD-10-CM | POA: Diagnosis not present

## 2022-06-04 DIAGNOSIS — Z5321 Procedure and treatment not carried out due to patient leaving prior to being seen by health care provider: Secondary | ICD-10-CM | POA: Insufficient documentation
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
## 2022-07-14 ENCOUNTER — Other Ambulatory Visit: Payer: Self-pay

## 2022-07-14 ENCOUNTER — Emergency Department (HOSPITAL_COMMUNITY)
Admission: EM | Admit: 2022-07-14 | Discharge: 2022-07-14 | Disposition: A | Discharge status: Home / Self Care | Payer: Medicaid Other | Attending: Emergency Medicine | Admitting: Emergency Medicine

## 2022-07-14 DIAGNOSIS — M542 Cervicalgia: Secondary | ICD-10-CM | POA: Diagnosis not present

## 2022-07-14 DIAGNOSIS — M79642 Pain in left hand: Secondary | ICD-10-CM | POA: Diagnosis not present

## 2022-07-14 DIAGNOSIS — G8929 Other chronic pain: Secondary | ICD-10-CM | POA: Diagnosis not present

## 2022-07-14 DIAGNOSIS — M549 Dorsalgia, unspecified: Secondary | ICD-10-CM | POA: Insufficient documentation

## 2022-07-14 LAB — POC URINE PREG, ED: Preg Test, Ur: NEGATIVE

## 2022-07-14 MED ORDER — CYCLOBENZAPRINE HCL 10 MG PO TABS: 10.0000 mg | ORAL_TABLET | Freq: Two times a day (BID) | ORAL | 0 refills | Status: AC | PRN

## 2022-07-14 MED ORDER — PREDNISONE 10 MG PO TABS: 30.0000 mg | ORAL_TABLET | Freq: Every day | ORAL | 0 refills | Status: AC
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
## 2022-07-14 NOTE — Discharge Instructions (Signed)
Your exam was reassuring, I have given you steroids and a muscle relaxer please take as prescribed continue with over-the-counter pain medication ibuprofen Tylenol.  Muscle relaxer's can make you drowsy do not consume alcohol or operate heavy machinery while taking this medication.  Recommend following up with orthopedic surgery, neurosurgery and/or community health and wellness for further evaluation.  Come back to the emergency department if you develop chest pain, shortness of breath, severe abdominal pain, uncontrolled nausea, vomiting, diarrhea.
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
